# Patient Record
Sex: Female | Born: 1990 | Hispanic: Yes | Marital: Married | Smoking: Never smoker
Health system: Southern US, Community
[De-identification: ages and names within clinical notes are randomized; demographics above are authoritative.]

## PROBLEM LIST (undated history)

## (undated) HISTORY — PX: NO PAST SURGERIES: SHX2092

---

## 2010-04-17 ENCOUNTER — Emergency Department: Payer: Self-pay | Admitting: Emergency Medicine

## 2013-04-17 ENCOUNTER — Emergency Department: Payer: Self-pay | Admitting: Emergency Medicine

## 2014-03-23 ENCOUNTER — Emergency Department: Payer: Self-pay | Admitting: Emergency Medicine

## 2014-03-23 LAB — URINALYSIS, COMPLETE
BILIRUBIN, UR: NEGATIVE
BLOOD: NEGATIVE
GLUCOSE, UR: NEGATIVE mg/dL (ref 0–75)
LEUKOCYTE ESTERASE: NEGATIVE
Nitrite: NEGATIVE
PH: 7 (ref 4.5–8.0)
Protein: NEGATIVE
RBC,UR: 1 /HPF (ref 0–5)
Specific Gravity: 1.014 (ref 1.003–1.030)
WBC UR: 5 /HPF (ref 0–5)

## 2014-03-23 LAB — WET PREP, GENITAL

## 2014-03-23 LAB — HCG, QUANTITATIVE, PREGNANCY: BETA HCG, QUANT.: 149437 m[IU]/mL — AB

## 2014-03-23 LAB — GC/CHLAMYDIA PROBE AMP

## 2015-02-02 IMAGING — US US OB < 14 WEEKS
1 series · 14 of 28 positions shown · non-contrast
Comparison: None.

CLINICAL DATA: Pelvic pain. Estimated gestational age by LMP is
unsure.

EXAM:
OBSTETRIC <14 WK ULTRASOUND
TECHNIQUE: Transabdominal ultrasound was performed for evaluation of the
gestation as well as the maternal uterus and adnexal regions.

[Series 1: us ob < 14 weeks · 0.21mm/px · 14 of 53 slices shown]
[im 2/53]
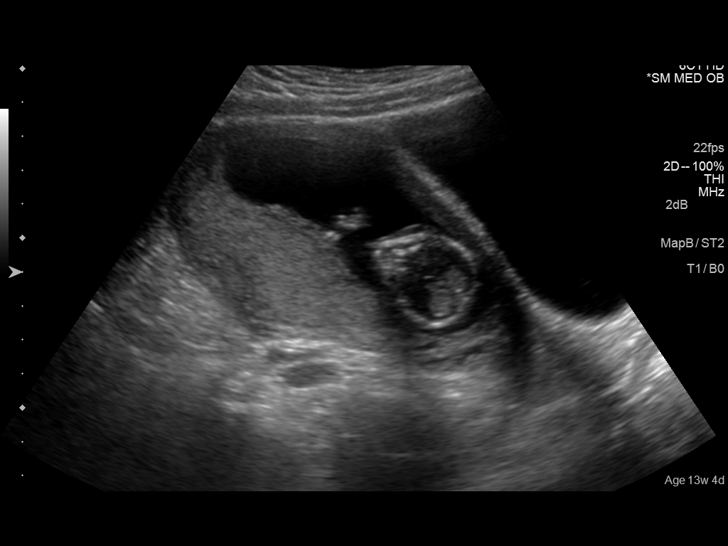
[im 6/53]
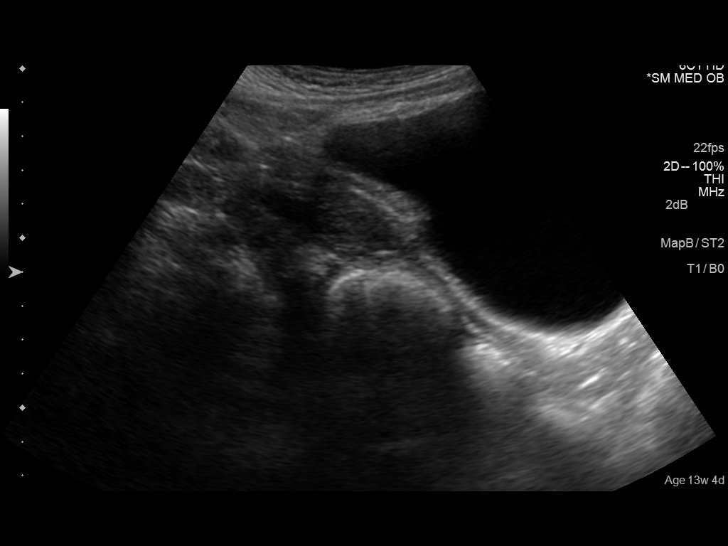
[im 10/53]
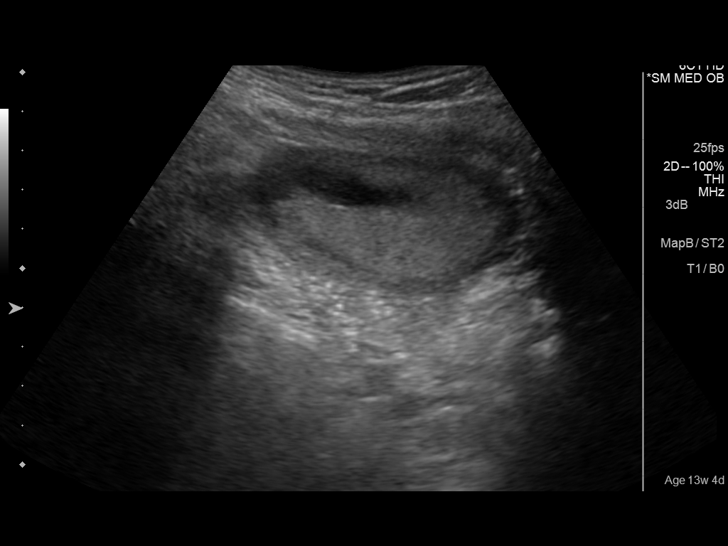
[im 14/53]
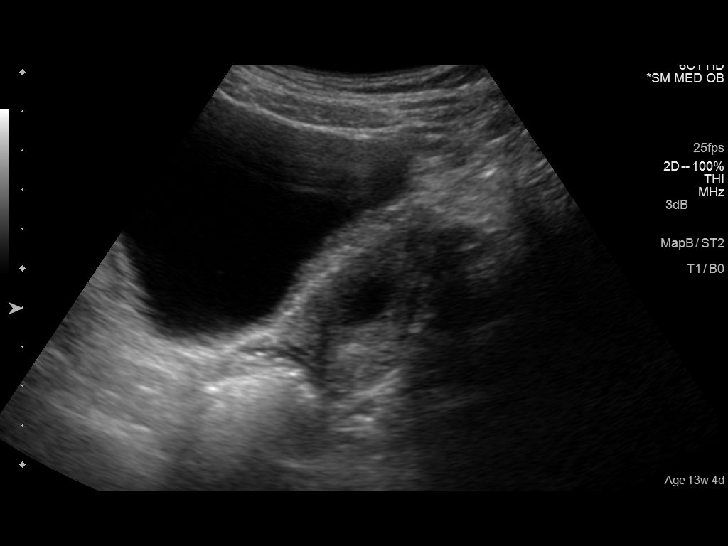
[im 18/53]
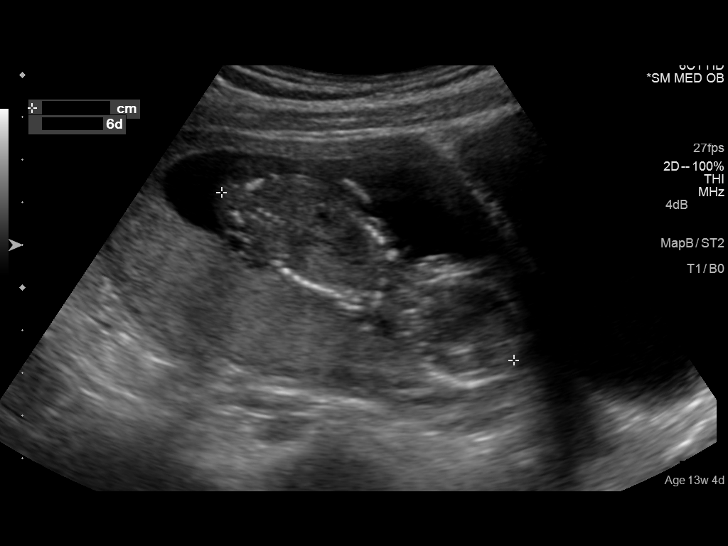
[im 22/53]
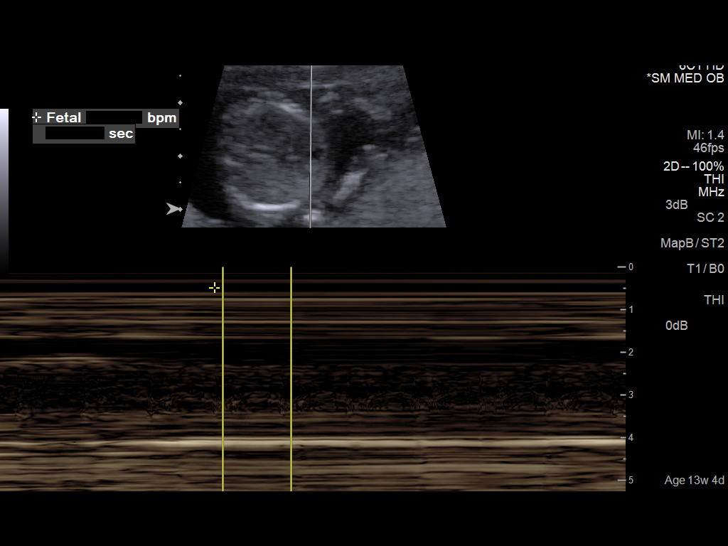
[im 26/53]
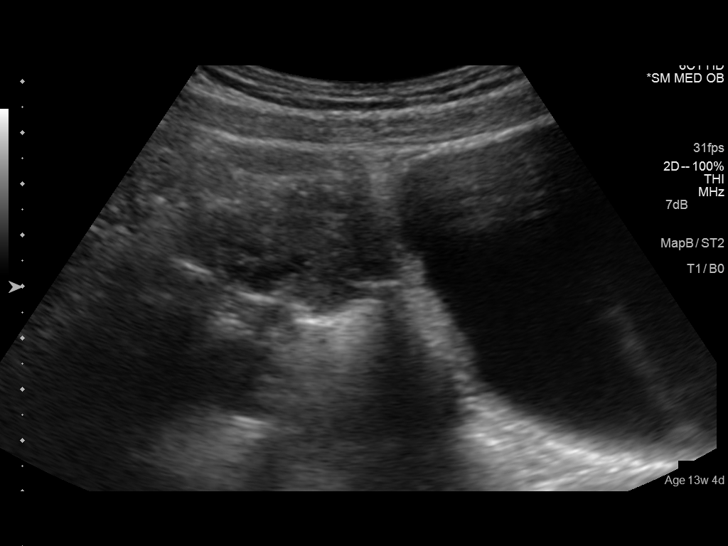
[im 29/53]
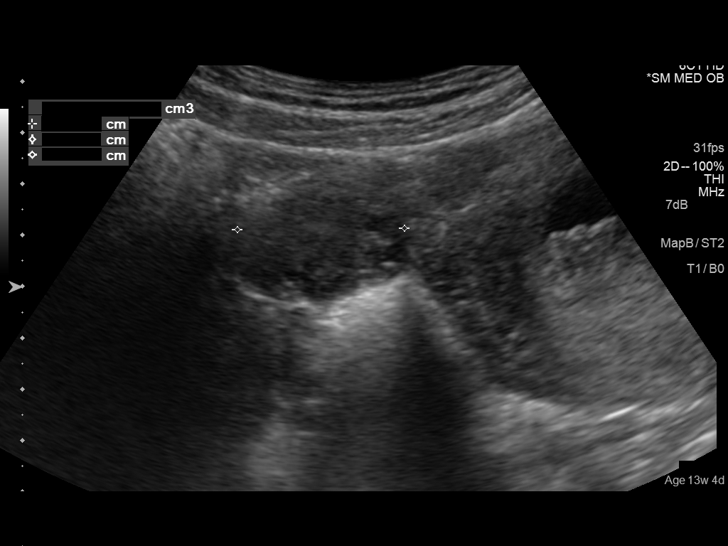
[im 33/53]
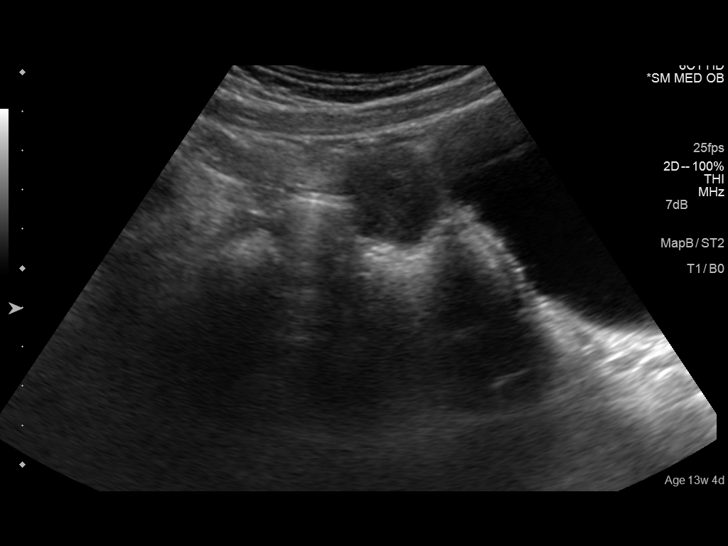
[im 37/53]
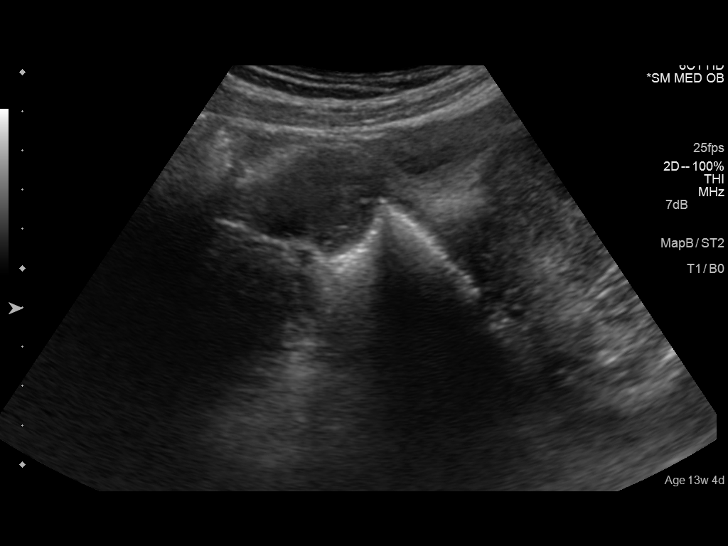
[im 41/53]
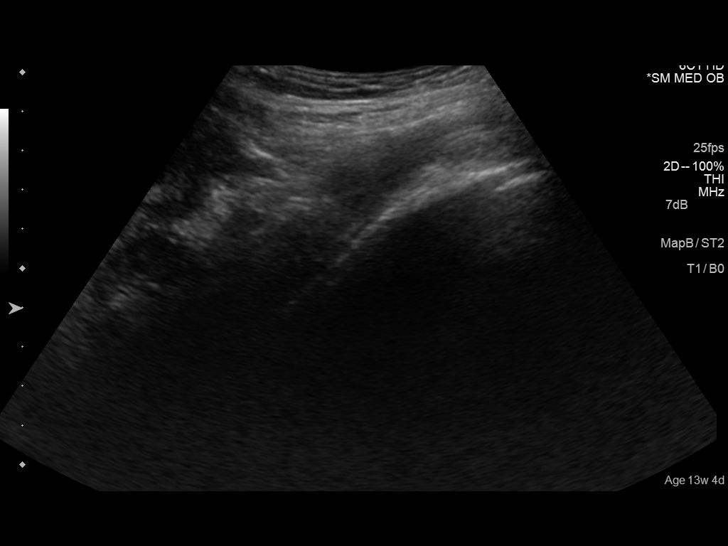
[im 45/53]
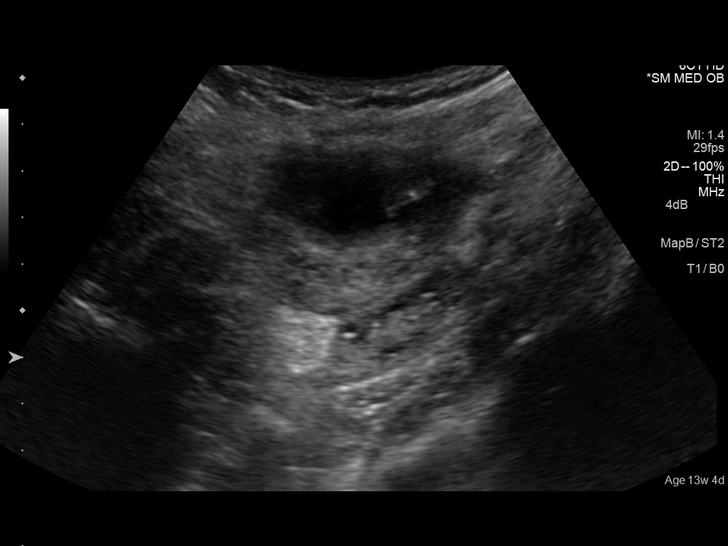
[im 49/53]
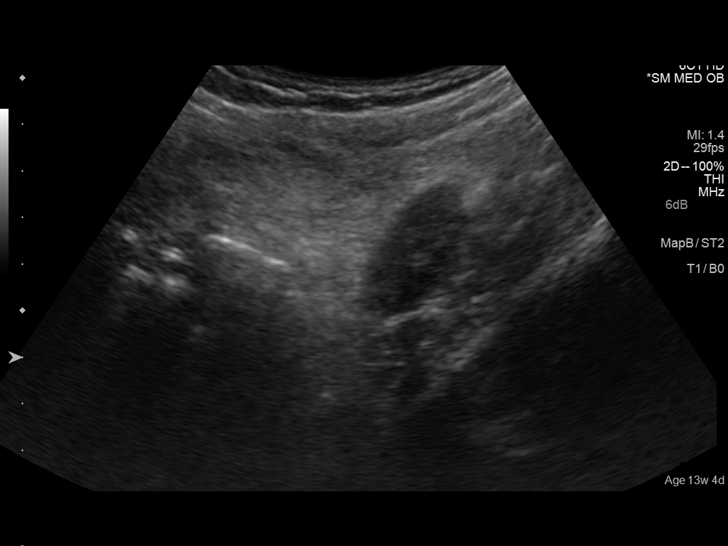
[im 53/53]
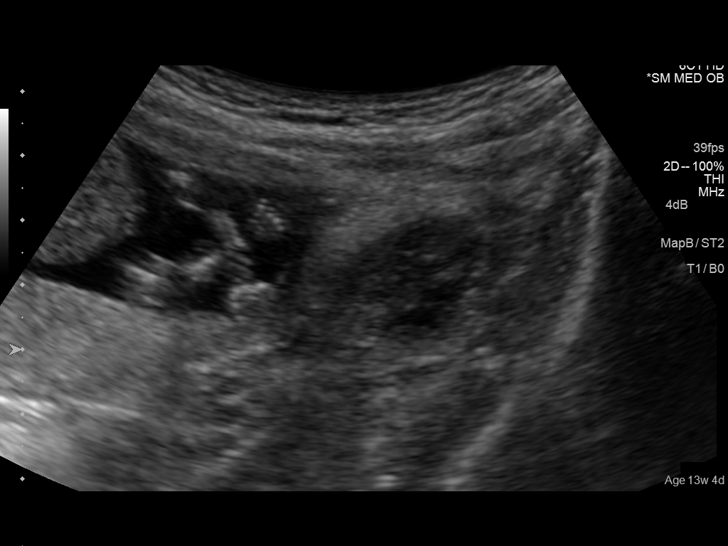

[14 of 28 positions shown; findings below may reference images not displayed]

FINDINGS: Intrauterine gestational sac: Visualized/normal in shape.

Yolk sac:  Not present at this time.

Embryo:  Visualized

Cardiac Activity: Visualized

Heart Rate: 150 bpm

CRL:   78.0  mm   13 w 6 d                  US EDC: 09/22/2014

Maternal uterus/adnexae: No subchorionic hemorrhage is seen. Both
maternal ovaries have normal appearances. No adnexal mass or free
pelvic fluid is identified.
IMPRESSION: Single living intrauterine pregnancy with estimated gestational age
by ultrasound of 13 weeks 6 days.

Ultrasound for fetal anatomic evaluation is recommended at 18 to 19
weeks gestational age.

## 2019-04-17 ENCOUNTER — Other Ambulatory Visit: Payer: Self-pay

## 2019-04-17 DIAGNOSIS — Z20822 Contact with and (suspected) exposure to covid-19: Secondary | ICD-10-CM

## 2019-04-18 LAB — NOVEL CORONAVIRUS, NAA: SARS-CoV-2, NAA: NOT DETECTED

## 2022-11-07 ENCOUNTER — Ambulatory Visit
Admission: EM | Admit: 2022-11-07 | Discharge: 2022-11-07 | Disposition: A | Discharge status: Home / Self Care | Payer: Managed Care, Other (non HMO) | Attending: Family Medicine | Admitting: Family Medicine

## 2022-11-07 DIAGNOSIS — Z3202 Encounter for pregnancy test, result negative: Secondary | ICD-10-CM | POA: Diagnosis present

## 2022-11-07 DIAGNOSIS — N939 Abnormal uterine and vaginal bleeding, unspecified: Secondary | ICD-10-CM | POA: Insufficient documentation

## 2022-11-07 LAB — PREGNANCY, URINE: Preg Test, Ur: NEGATIVE

## 2022-11-07 LAB — HCG, QUANTITATIVE, PREGNANCY: hCG, Beta Chain, Quant, S: 3 m[IU]/mL (ref <5)

## 2022-11-07 NOTE — ED Provider Notes (Signed)
[facility]    CSN: [id number] Arrival date & time: 11/07/22  1623      History   Chief Complaint Chief Complaint  Patient presents with   Pregnant   Vaginal Bleeding     HPI HPI Janet Dunn is a 32 y.o. female.    Janet Dunn presents for vaginal bleeding that started yesterday.  She had some back pain and lower abdominal pain.  She took a pregnancy test last week that positive. Another was negative. Last period started on 09/22/22.  No birth control in the last 90 days.   - Vaginal discharge: no - vaginal bleeding: yes - Dysuria: no - Hematuria: no - Urinary urgency: no - Urinary frequency: no  - Fever: no - Abdominal pain yes - Pelvic pain: yes - Rash/Skin lesions/mouth ulcers: no - Nausea: no  - Vomiting: no  - Back Pain: yes - Headache: no       No past medical history on file.  There are no problems to display for this patient.     OB History     Gravida  1   Para      Term      Preterm      AB      Living         SAB      IAB      Ectopic      Multiple      Live Births               Home Medications    Prior to Admission medications   Not on File    Family History No family history on file.  Social History Social History   Tobacco Use   Smoking status: Never   Smokeless tobacco: Never  Vaping Use   Vaping Use: Never used  Substance Use Topics   Alcohol use: Never   Drug use: Never     Allergies   Patient has no known allergies.   Review of Systems Review of Systems: :negative unless otherwise stated in HPI.      Physical Exam Triage Vital Signs ED Triage Vitals  Enc Vitals Group     BP 11/07/22 1644 117/78     Pulse Rate 11/07/22 1644 70     Resp 11/07/22 1644 16     Temp 11/07/22 1644 98.9 F (37.2 C)     Temp Source 11/07/22 1644 Oral     SpO2 11/07/22 1644 100 %     Weight 11/07/22 1644 130 lb (59 kg)     Height 11/07/22 1644 5\' 2"  (1.575 m)      Head Circumference --      Peak Flow --      Pain Score 11/07/22 1648 0     Pain Loc --      Pain Edu? --      Excl. in GC? --    No data found.  Updated Vital Signs BP 117/78 (BP Location: Left Arm)   Pulse 70   Temp 98.9 F (37.2 C) (Oral)   Resp 16   Ht 5\' 2"  (1.575 m)   Wt 59 kg   SpO2 100%   BMI 23.78 kg/m   Visual Acuity Right Eye Distance:   Left Eye Distance:   Bilateral Distance:    Right Eye Near:   Left Eye Near:    Bilateral Near:     Physical Exam GEN: well appearing female  in no acute distress  CVS: well perfused  RESP: speaking in full sentences without pause  ABD: soft, non-tender, non-distended, no palpable masses   GU: uterus below umbilicus  SKIN: warm, dry    UC Treatments / Results  Labs (all labs ordered are listed, but only abnormal results are displayed) Labs Reviewed  HCG, QUANTITATIVE, PREGNANCY  PREGNANCY, URINE    EKG   Radiology No results found.  Procedures Procedures (including critical care time)  Medications Ordered in UC Medications - No data to display  Initial Impression / Assessment and Plan / UC Course  I have reviewed the triage vital signs and the nursing notes.  Pertinent labs & imaging results that were available during my care of the patient were reviewed by me and considered in my medical decision making (see chart for details).      Patient is a 32 y.o. [id number] who presents for vaginal bleeding with concern for pregnancy.  Overall patient is well-appearing and afebrile.  Vital signs stable.  Urine pregnancy test is negative. Beta HcG is 3. Labs do not support that Janet Dunn is pregnant. I suspect pt had false negative. She is to follow up with her primary care provider or OB for repeat urine pregnancy in 2 weeks. Discussed MDM, treatment plan and plan for follow-up with patient/parent who agrees with plan.    Final Clinical Impressions(s) / UC Diagnoses   Final diagnoses:  Vaginal bleeding  Encounter  for pregnancy test with result negative     Discharge Instructions      Your urine pregnancy test is negative. Your blood level did not suggest pregnancy.       ED Prescriptions   None    PDMP not reviewed this encounter.   Katha Cabal, DO 11/13/22 [id number]

## 2022-11-07 NOTE — ED Triage Notes (Signed)
Pt states she is 1 mon pregnant & started to bleed since yesterday. States when she uses the restroom the blood is heavy & they look like thick clots & w/mucus.  States she yesterday she had lower abd & back pain.

## 2022-11-07 NOTE — Discharge Instructions (Signed)
Your urine pregnancy test is negative. Your blood level did not suggest pregnancy.

## 2024-04-27 ENCOUNTER — Ambulatory Visit: Admission: EM | Admit: 2024-04-27 | Discharge: 2024-04-27 | Disposition: A | Discharge status: Home / Self Care

## 2024-04-27 DIAGNOSIS — S39012A Strain of muscle, fascia and tendon of lower back, initial encounter: Secondary | ICD-10-CM | POA: Diagnosis not present
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
